# Patient Record
Sex: Female | Born: 1950 | Race: White | Hispanic: No | Marital: Married
Health system: Southern US, Community
[De-identification: ages and names within clinical notes are randomized; demographics above are authoritative.]

---

## 2000-02-12 ENCOUNTER — Other Ambulatory Visit: Admission: RE | Admit: 2000-02-12 | Discharge: 2000-02-12 | Payer: Self-pay | Admitting: *Deleted

## 2001-02-11 ENCOUNTER — Encounter: Payer: Self-pay | Admitting: Internal Medicine

## 2001-02-11 ENCOUNTER — Encounter: Admission: RE | Admit: 2001-02-11 | Discharge: 2001-02-11 | Payer: Self-pay | Admitting: Internal Medicine

## 2002-02-28 ENCOUNTER — Encounter: Payer: Self-pay | Admitting: Internal Medicine

## 2002-02-28 ENCOUNTER — Encounter: Admission: RE | Admit: 2002-02-28 | Discharge: 2002-02-28 | Payer: Self-pay | Admitting: Internal Medicine

## 2002-03-08 ENCOUNTER — Encounter: Payer: Self-pay | Admitting: Internal Medicine

## 2002-03-08 ENCOUNTER — Encounter: Admission: RE | Admit: 2002-03-08 | Discharge: 2002-03-08 | Payer: Self-pay | Admitting: Internal Medicine

## 2004-05-02 ENCOUNTER — Encounter: Admission: RE | Admit: 2004-05-02 | Discharge: 2004-05-02 | Payer: Self-pay | Admitting: Internal Medicine

## 2004-05-06 ENCOUNTER — Encounter: Admission: RE | Admit: 2004-05-06 | Discharge: 2004-05-06 | Payer: Self-pay | Admitting: Internal Medicine

## 2005-05-25 ENCOUNTER — Encounter: Admission: RE | Admit: 2005-05-25 | Discharge: 2005-05-25 | Payer: Self-pay | Admitting: Internal Medicine

## 2007-04-08 ENCOUNTER — Encounter: Admission: RE | Admit: 2007-04-08 | Discharge: 2007-04-08 | Payer: Self-pay | Admitting: Internal Medicine

## 2008-04-03 ENCOUNTER — Encounter: Admission: RE | Admit: 2008-04-03 | Discharge: 2008-04-03 | Payer: Self-pay | Admitting: Internal Medicine

## 2009-02-16 IMAGING — US US TRANSVAGINAL NON-OB
1 series · 13 of 25 positions shown · non-contrast
Comparison: none

CLINICAL DATA: Pelvic pain. Status-post hysterectomy in 6344.
TRANSABDOMINAL AND TRANSVAGINAL PELVIC ULTRASOUND:
TECHNIQUE: Multiple images of the pelvis were obtained using a transabdominal and endovaginal approaches.

[Series 1: unknown · 0.24mm/px · 13 of 28 slices shown]
[im 1/28]
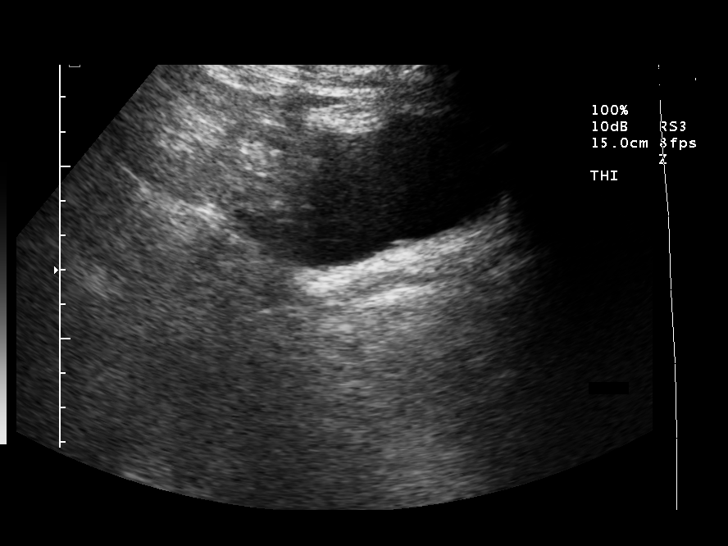
[im 3/28]
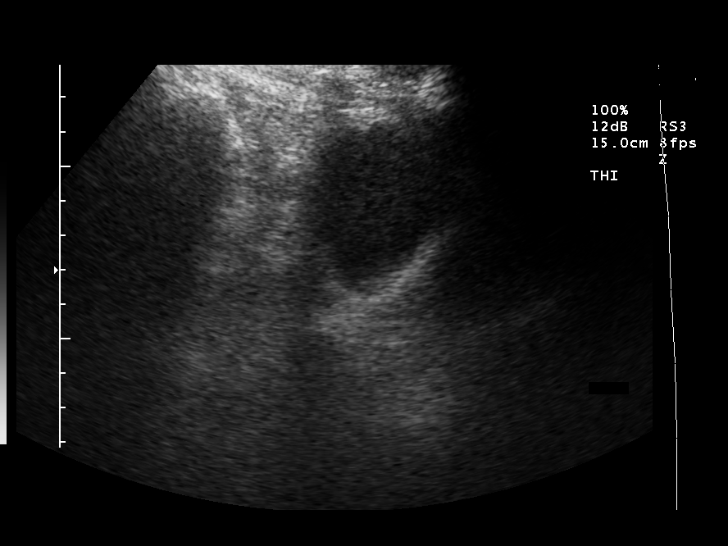
[im 5/28]
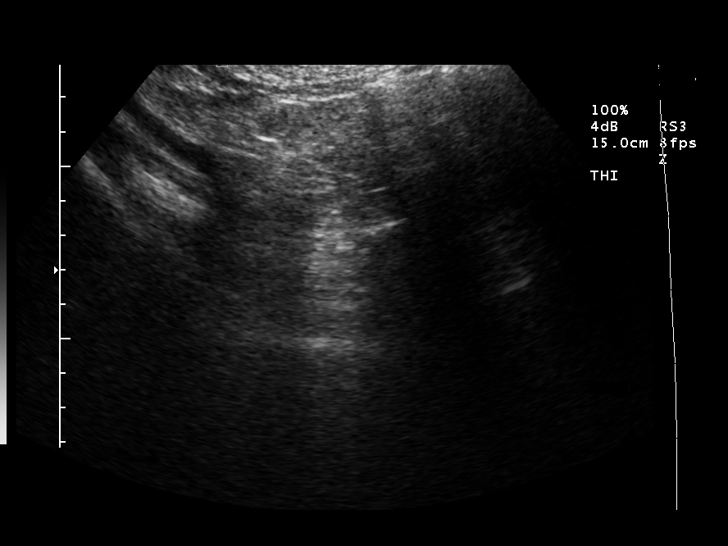
[im 7/28]
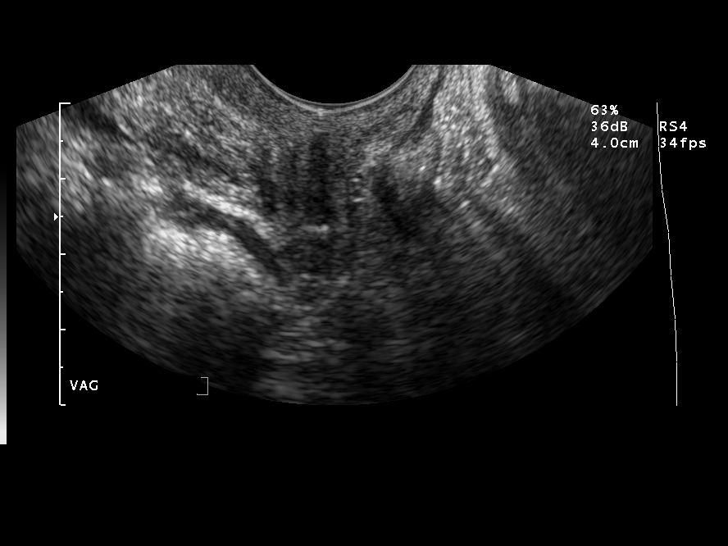
[im 10/28]
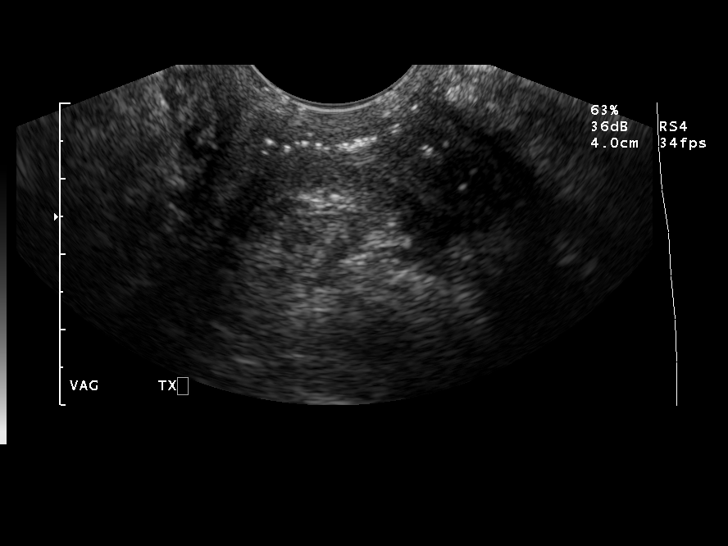
[im 12/28]
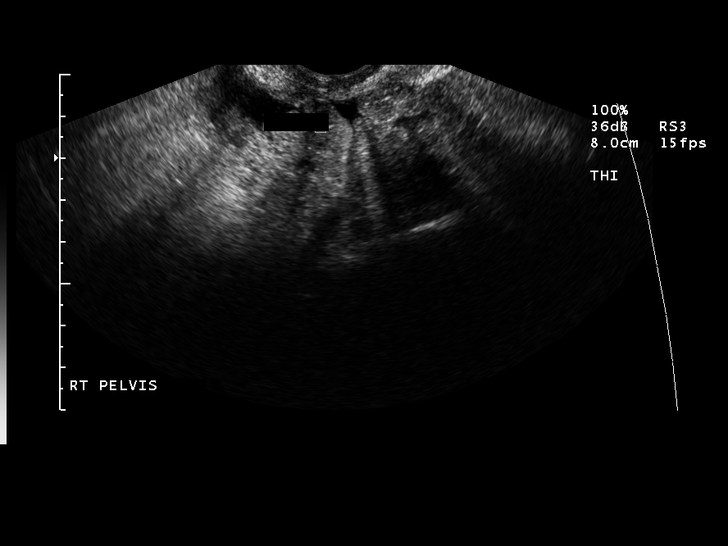
[im 14/28]
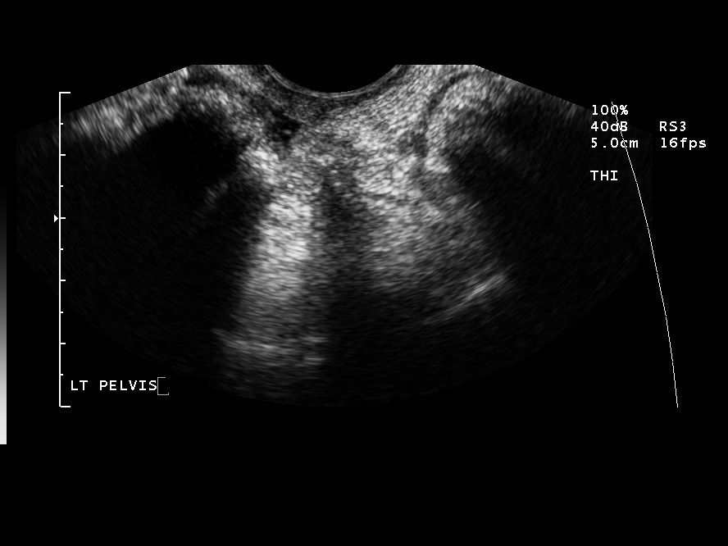
[im 16/28]
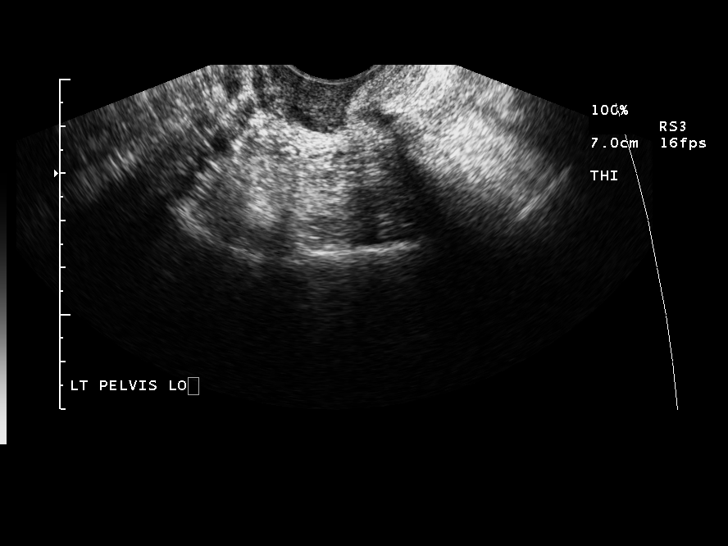
[im 19/28]
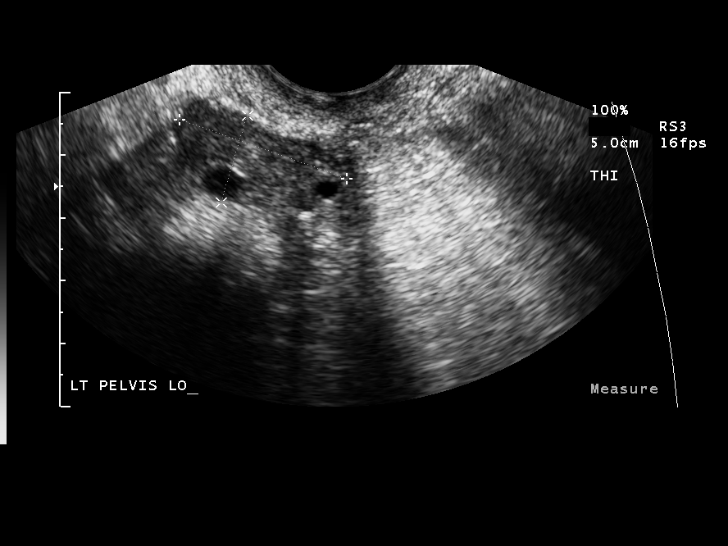
[im 21/28]
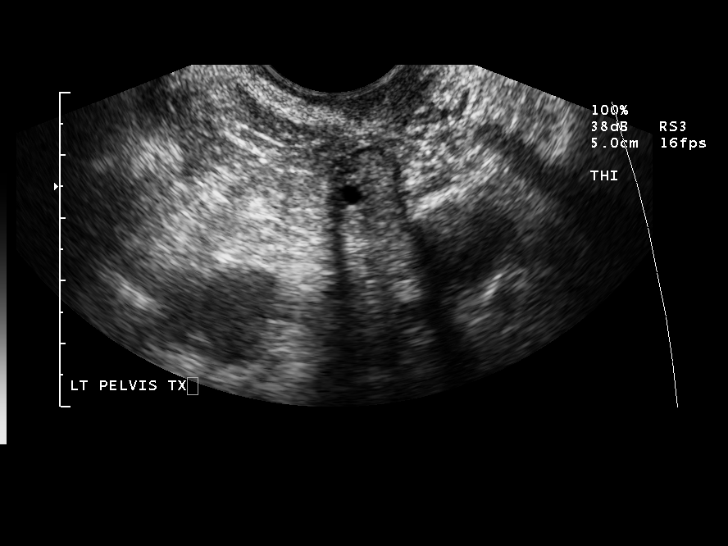
[im 23/28]
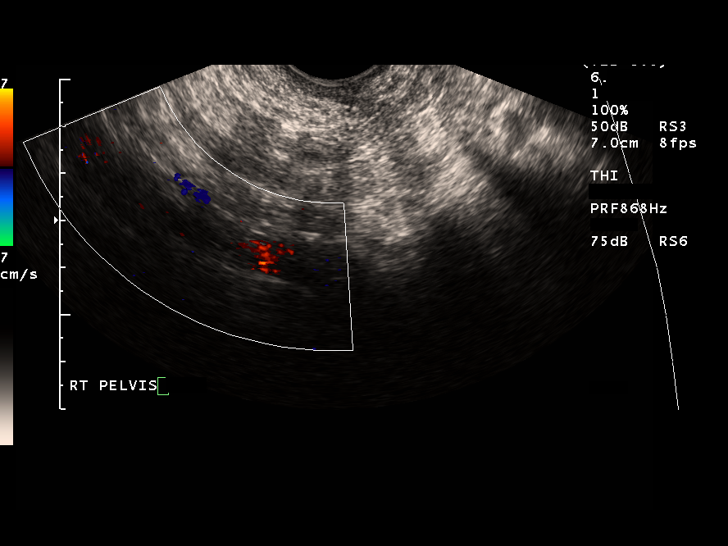
[im 25/28]
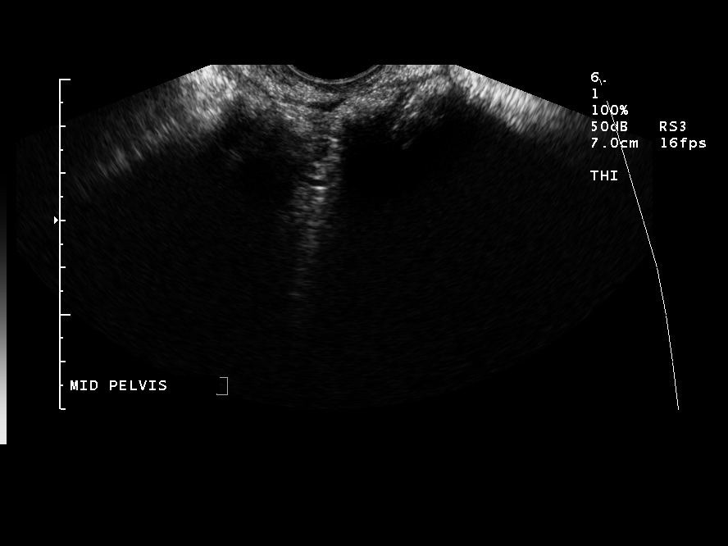
[im 28/28]
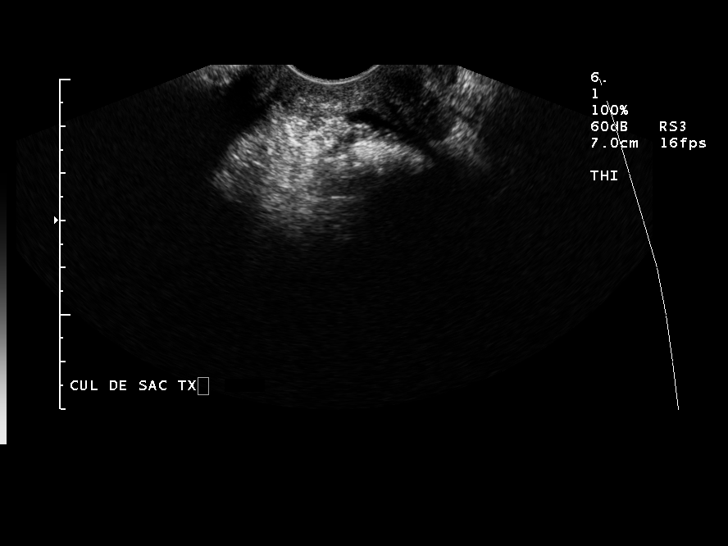

[13 of 25 positions shown; findings below may reference images not displayed]

FINDINGS: There is a normal vaginal cuff identified.  The left ovary was well seen, measuring 2.8 x 1.4 x 1.1 cm.  This contains two subcentimeter simple cysts, which in a premenopausal patient would correlate with follicles.   the patient is felt to be clinically postmenopausal, these likely represent benign postmenopausal cysts given their appearance and small size. These can be followed up in 3 months to assess for stability. Two small peripheral calcifications are identified measuring on the order of 2 mm.  These have no associated mass effect and are felt to be benign. 
The right ovary could not be seen with confidence and there is a large amount of shadowing from bowel in the right hemipelvis.  A small amount of simple free fluid is identified in the cul-de-sac.
IMPRESSION: 1.  Normal vaginal cuff.
2.  Small subcentimeter cystic areas within the left ovary and two stromal calcifications felt likely to be indicative of benign findings. Follow-up in 3 months is recommended for initial short-term reassessment if the patient is felt clinically to be postmenopausal.
3.  Nonvisualized right ovary.  If the patient?s clinical history of pelvic pain continues, further evaluation with CT can be undertaken to assess for a nongynecologic etiology for this finding.

## 2009-07-15 ENCOUNTER — Encounter: Admission: RE | Admit: 2009-07-15 | Discharge: 2009-07-15 | Payer: Self-pay | Admitting: Internal Medicine

## 2010-09-20 ENCOUNTER — Encounter: Payer: Self-pay | Admitting: Internal Medicine

## 2010-09-30 ENCOUNTER — Encounter
Admission: RE | Admit: 2010-09-30 | Discharge: 2010-09-30 | Payer: Self-pay | Source: Home / Self Care | Attending: Internal Medicine | Admitting: Internal Medicine

## 2011-09-24 ENCOUNTER — Other Ambulatory Visit: Payer: Self-pay | Admitting: Internal Medicine

## 2011-09-24 DIAGNOSIS — Z1231 Encounter for screening mammogram for malignant neoplasm of breast: Secondary | ICD-10-CM

## 2011-10-06 ENCOUNTER — Ambulatory Visit
Admission: RE | Admit: 2011-10-06 | Discharge: 2011-10-06 | Disposition: A | Discharge status: Home / Self Care | Payer: 59 | Source: Ambulatory Visit | Attending: Internal Medicine | Admitting: Internal Medicine

## 2011-10-06 DIAGNOSIS — Z1231 Encounter for screening mammogram for malignant neoplasm of breast: Secondary | ICD-10-CM

## 2011-10-13 ENCOUNTER — Other Ambulatory Visit: Payer: Self-pay | Admitting: Internal Medicine

## 2011-10-13 DIAGNOSIS — R928 Other abnormal and inconclusive findings on diagnostic imaging of breast: Secondary | ICD-10-CM

## 2011-10-20 ENCOUNTER — Ambulatory Visit
Admission: RE | Admit: 2011-10-20 | Discharge: 2011-10-20 | Disposition: A | Discharge status: Home / Self Care | Payer: 59 | Source: Ambulatory Visit | Attending: Internal Medicine | Admitting: Internal Medicine

## 2011-10-20 DIAGNOSIS — R928 Other abnormal and inconclusive findings on diagnostic imaging of breast: Secondary | ICD-10-CM

## 2012-08-10 IMAGING — CR DG CHEST 2V
2 series · 2 of 2 positions shown · non-contrast
Comparison: None.

CLINICAL DATA: Right upper back and lower chest pain

CHEST - 2 VIEW

[w chest pa]
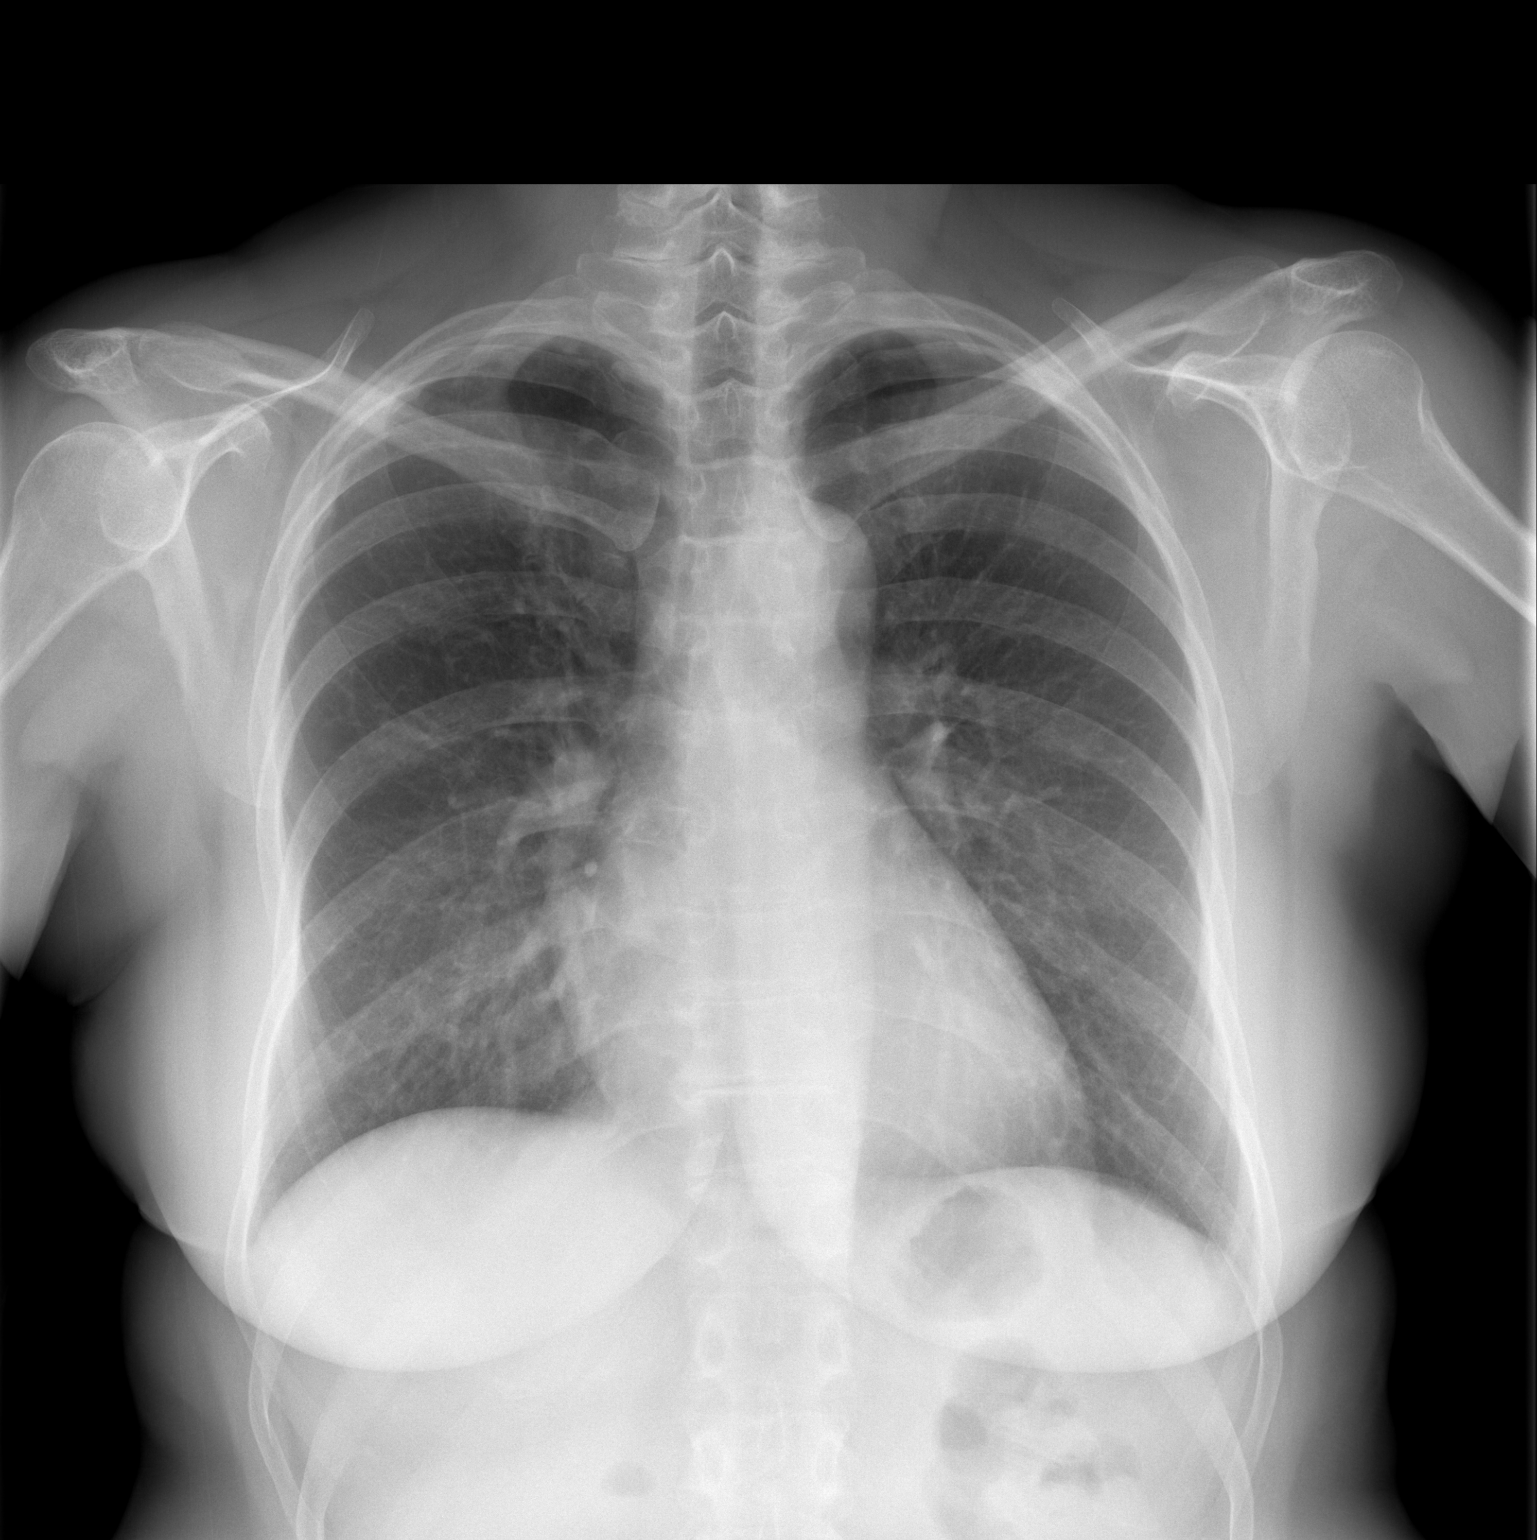

[w chest lat]
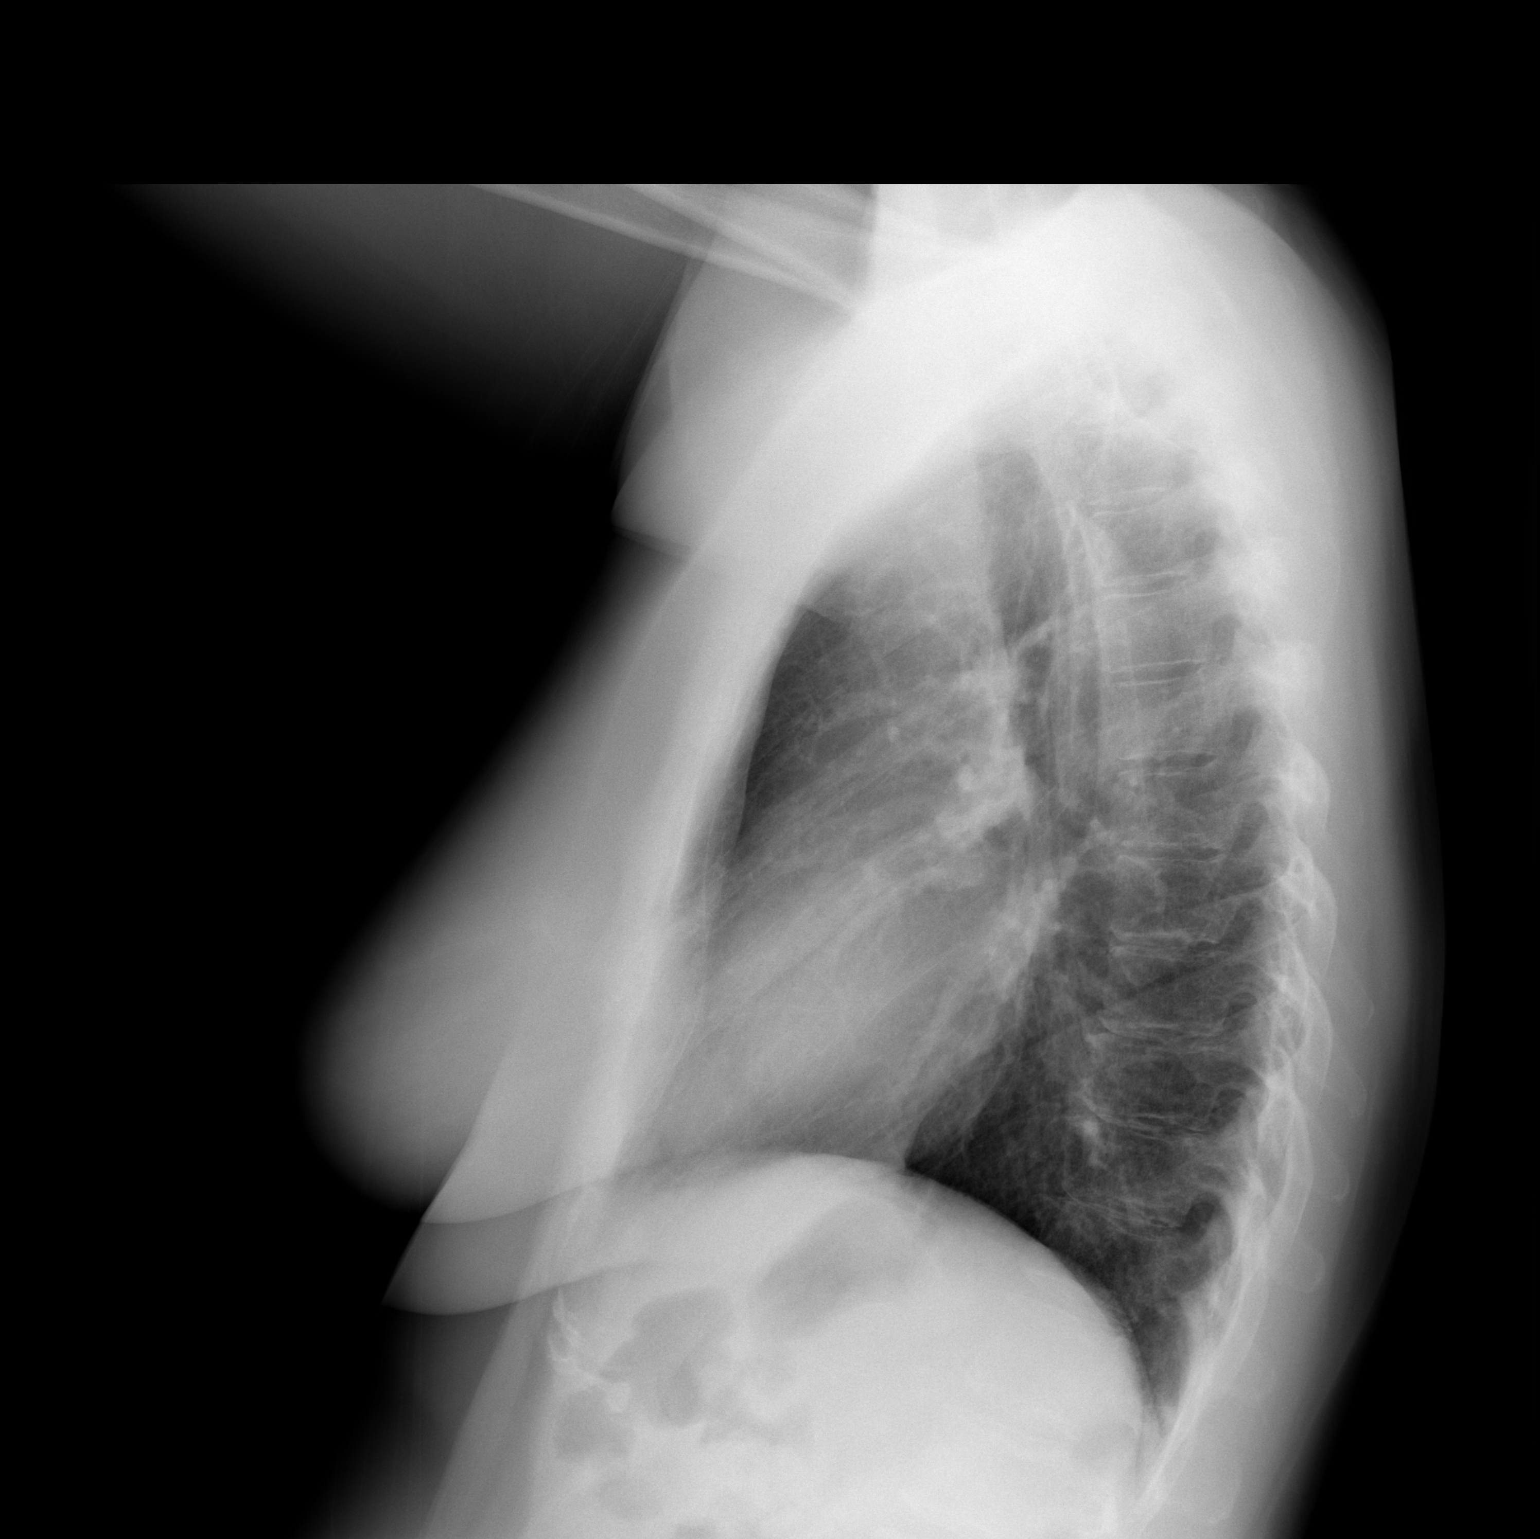

[2 of 2 positions shown; findings below may reference images not displayed]

FINDINGS: The lungs are clear.  Mediastinal contours appear normal.
The heart is within normal limits in size.  No bony abnormality is
seen.
IMPRESSION: No active lung disease.

## 2013-06-16 ENCOUNTER — Other Ambulatory Visit: Payer: Self-pay

## 2013-06-16 DIAGNOSIS — Z1231 Encounter for screening mammogram for malignant neoplasm of breast: Secondary | ICD-10-CM

## 2013-07-04 ENCOUNTER — Ambulatory Visit
Admission: RE | Admit: 2013-07-04 | Discharge: 2013-07-04 | Disposition: A | Discharge status: Home / Self Care | Payer: 59 | Source: Ambulatory Visit

## 2013-07-04 DIAGNOSIS — Z1231 Encounter for screening mammogram for malignant neoplasm of breast: Secondary | ICD-10-CM

## 2013-08-08 ENCOUNTER — Ambulatory Visit: Payer: 59 | Admitting: Cardiology

## 2019-05-30 ENCOUNTER — Other Ambulatory Visit: Payer: Self-pay | Admitting: Internal Medicine

## 2019-10-22 ENCOUNTER — Ambulatory Visit: Payer: Self-pay | Attending: Internal Medicine

## 2019-10-22 DIAGNOSIS — Z23 Encounter for immunization: Secondary | ICD-10-CM | POA: Insufficient documentation

## 2019-10-22 NOTE — Progress Notes (Signed)
   Covid-19 Vaccination Clinic  Name:  Umaima Scholten    MRN: [id number] DOB: 1951/05/08  10/22/2019  Ms. Araki was observed post Covid-19 immunization for 15 minutes without incidence. She was provided with Vaccine Information Sheet and instruction to access the V-Safe system.   Ms. Rail was instructed to call 911 with any severe reactions post vaccine: Marland Kitchen Difficulty breathing  . Swelling of your face and throat  . A fast heartbeat  . A bad rash all over your body  . Dizziness and weakness    Immunizations Administered    Name Date Dose VIS Date Route   Pfizer COVID-19 Vaccine 10/22/2019 11:05 AM 0.3 mL 08/11/2019 Intramuscular   Manufacturer: [employer], [employer]   Lot: [id number]   NDC: [address]-0

## 2019-11-15 ENCOUNTER — Ambulatory Visit: Payer: Self-pay | Attending: Internal Medicine

## 2019-11-15 DIAGNOSIS — Z23 Encounter for immunization: Secondary | ICD-10-CM

## 2019-11-15 NOTE — Progress Notes (Signed)
   Covid-19 Vaccination Clinic  Name:  Michelle Bailey    MRN: [id number] DOB: Apr 21, 1951  11/15/2019  [name] was observed post Covid-19 immunization for 15 minutes without incident. She was provided with Vaccine Information Sheet and instruction to access the V-Safe system.   [name] was instructed to call 911 with any severe reactions post vaccine: Marland Kitchen Difficulty breathing  . Swelling of face and throat  . A fast heartbeat  . A bad rash all over body  . Dizziness and weakness   Immunizations Administered    Name Date Dose VIS Date Route   Pfizer COVID-19 Vaccine 11/15/2019  8:33 AM 0.3 mL 08/11/2019 Intramuscular   Manufacturer: [employer], [employer]   Lot: [id number]   NDC: [address]-0
